# Patient Record
Sex: Female | Born: 1990 | Race: Black or African American | Hispanic: No | Marital: Single | Smoking: Never smoker
Health system: Southern US, Community
[De-identification: ages and names within clinical notes are randomized; demographics above are authoritative.]

---

## 2013-08-23 ENCOUNTER — Emergency Department: Payer: Self-pay | Admitting: Internal Medicine

## 2013-08-25 ENCOUNTER — Ambulatory Visit: Payer: Self-pay | Admitting: Family Medicine

## 2013-10-16 ENCOUNTER — Encounter: Payer: Self-pay | Admitting: Family Medicine

## 2013-10-16 ENCOUNTER — Ambulatory Visit (INDEPENDENT_AMBULATORY_CARE_PROVIDER_SITE_OTHER): Payer: BC Managed Care – PPO | Admitting: Family Medicine

## 2013-10-16 VITALS — BP 112/64 | HR 75 | Temp 98.3°F | Ht 65.0 in | Wt 191.2 lb

## 2013-10-16 DIAGNOSIS — R21 Rash and other nonspecific skin eruption: Secondary | ICD-10-CM

## 2013-10-16 MED ORDER — KETOCONAZOLE 2 % EX CREA: TOPICAL_CREAM | CUTANEOUS | Status: AC

## 2013-10-16 NOTE — Progress Notes (Signed)
Pre visit review using our clinic review tool, if applicable. No additional management support is needed unless otherwise documented below in the visit note. 

## 2013-10-16 NOTE — Assessment & Plan Note (Signed)
Appearance and distribution consistent with tinea versicolor.  Will treat with 21 day course of ketoconazole cream 2%.  She will follow up with me in 1 month- also CPX/pap at that time.

## 2013-10-16 NOTE — Progress Notes (Signed)
   Subjective:   Patient ID: Savannah Robinson, female    DOB: [birth year], 23 y.o.   MRN: [id number]  Savannah Robinson is a pleasant 23 y.o. year old female who presents to clinic today with Establish Care  on 10/16/2013  HPI: Rash - hypopigmented on neck and upper arms, intermittently for almost two years.  Seems worse in the summer or when she gets hot. Not itchy.  Otherwise has no complaints.  Patient Active Problem List   Diagnosis Date Noted  . Rash and nonspecific skin eruption 10/16/2013   History reviewed. No pertinent past medical history. History reviewed. No pertinent past surgical history. History  Substance Use Topics  . Smoking status: Never Smoker   . Smokeless tobacco: Never Used  . Alcohol Use: No   Family History  Problem Relation Age of Onset  . Hypertension Maternal Grandmother   . Mental illness Maternal Grandmother   . Diabetes Maternal Grandmother   . Mental illness Paternal Grandmother    No Known Allergies No current outpatient prescriptions on file prior to visit.   No current facility-administered medications on file prior to visit.   The PMH, PSH, Social History, Family History, Medications, and allergies have [facility], and have been updated if relevant.   Review of Systems    See HPI No painful lesions, it is sometimes dry and raised Objective:    BP 112/64  Pulse 75  Temp(Src) 98.3 F (36.8 C) (Oral)  Ht 5\' 5"  (1.651 m)  Wt 191 lb 4 oz (86.75 kg)  BMI 31.83 kg/m2  SpO2 98%  LMP 09/29/2013   Physical Exam  Constitutional: She is oriented to person, place, and time. She appears well-developed and well-nourished. No distress.  Musculoskeletal: Normal range of motion.  Neurological: She is alert and oriented to person, place, and time.  Skin: Skin is warm. Rash noted.     Psychiatric: She has a normal mood and affect. Her behavior is normal. Judgment and thought content normal.          Assessment & Plan:   Rash and  nonspecific skin eruption Return in about 1 month (around 11/16/2013) for a complete physical..

## 2013-10-16 NOTE — Patient Instructions (Addendum)
Please come back at your convenience for a complete physical and pap smear.   Tinea Versicolor Tinea versicolor is a common yeast infection of the skin. This condition becomes known when the yeast on our skin starts to overgrow (yeast is a normal inhabitant on our skin). This condition is noticed as white or light brown patches on brown skin, and is more evident in the summer on tanned skin. These areas are slightly scaly if scratched. The light patches from the yeast become evident when the yeast creates "holes in your suntan". This is most often noticed in the summer. The patches are usually located on the chest, back, pubis, neck and body folds. However, it may occur on any area of body. Mild itching and inflammation (redness or soreness) may be present. DIAGNOSIS  The diagnosisof this is made clinically (by looking). Cultures from samples are usually not needed. Examination under the microscope may help. However, yeast is normally found on skin. The diagnosis still remains clinical. Examination under Wood's Ultraviolet Light can determine the extent of the infection. TREATMENT  This common infection is usually only of cosmetic (only a concern to your appearance). It is easily treated with dandruff shampoo used during showers or bathing. Vigorous scrubbing will eliminate the yeast over several days time. The light areas in your skin may remain for weeks or months after the infection is cured unless your skin is exposed to sunlight. The lighter or darker spots caused by the fungus that remain after complete treatment are not a sign of treatment failure; it will take a long time to resolve. Your caregiver may recommend a number of commercial preparations or medication by mouth if home care is not working. Recurrence is common and preventative medication may be necessary. This skin condition is not highly contagious. Special care is not needed to protect close friends and family members. Normal hygiene is  usually enough. Follow up is required only if you develop complications (such as a secondary infection from scratching), if recommended by your caregiver, or if no relief is obtained from the preparations used. Document Released: 07/31/2000 Document Revised: 10/26/2011 Document Reviewed: 09/12/2008 [facility] Patient Information 2014 [place], [place].

## 2013-12-08 ENCOUNTER — Other Ambulatory Visit: Payer: Self-pay | Admitting: Family Medicine

## 2013-12-08 DIAGNOSIS — Z0000 Encounter for general adult medical examination without abnormal findings: Secondary | ICD-10-CM

## 2013-12-08 DIAGNOSIS — Z136 Encounter for screening for cardiovascular disorders: Secondary | ICD-10-CM

## 2013-12-19 ENCOUNTER — Other Ambulatory Visit (INDEPENDENT_AMBULATORY_CARE_PROVIDER_SITE_OTHER): Payer: BC Managed Care – PPO

## 2013-12-19 DIAGNOSIS — Z136 Encounter for screening for cardiovascular disorders: Secondary | ICD-10-CM

## 2013-12-19 DIAGNOSIS — Z0000 Encounter for general adult medical examination without abnormal findings: Secondary | ICD-10-CM

## 2013-12-20 LAB — COMPREHENSIVE METABOLIC PANEL
ALBUMIN: 4.1 g/dL (ref 3.5–5.2)
ALK PHOS: 31 U/L — AB (ref 39–117)
ALT: 11 U/L (ref 0–35)
AST: 15 U/L (ref 0–37)
BUN: 11 mg/dL (ref 6–23)
CHLORIDE: 108 meq/L (ref 96–112)
CO2: 27 mEq/L (ref 19–32)
CREATININE: 0.9 mg/dL (ref 0.4–1.2)
Calcium: 9.5 mg/dL (ref 8.4–10.5)
GFR: 87.92 mL/min (ref >60.00)
Glucose, Bld: 78 mg/dL (ref 70–99)
Potassium: 4.2 mEq/L (ref 3.5–5.1)
SODIUM: 140 meq/L (ref 135–145)
Total Bilirubin: 0.4 mg/dL (ref 0.2–1.2)
Total Protein: 7 g/dL (ref 6.0–8.3)

## 2013-12-20 LAB — CBC WITH DIFFERENTIAL/PLATELET
BASOS ABS: 0.1 10*3/uL (ref 0.0–0.1)
Basophils Relative: 4.2 % — ABNORMAL HIGH (ref 0.0–3.0)
EOS ABS: 0.1 10*3/uL (ref 0.0–0.7)
Eosinophils Relative: 4.4 % (ref 0.0–5.0)
HCT: 37.3 % (ref 36.0–46.0)
HEMOGLOBIN: 12.7 g/dL (ref 12.0–15.0)
LYMPHS PCT: 41.9 % (ref 12.0–46.0)
Lymphs Abs: 1.3 10*3/uL (ref 0.7–4.0)
MCHC: 34.2 g/dL (ref 30.0–36.0)
MCV: 91.6 fl (ref 78.0–100.0)
Monocytes Absolute: 0.2 10*3/uL (ref 0.1–1.0)
Monocytes Relative: 6.9 % (ref 3.0–12.0)
NEUTROS ABS: 1.4 10*3/uL (ref 1.4–7.7)
Neutrophils Relative %: 42.6 % — ABNORMAL LOW (ref 43.0–77.0)
PLATELETS: 200 10*3/uL (ref 150.0–400.0)
RBC: 4.07 Mil/uL (ref 3.87–5.11)
RDW: 12.6 % (ref 11.5–15.5)
WBC: 3.2 10*3/uL — ABNORMAL LOW (ref 4.0–10.5)

## 2013-12-20 LAB — LIPID PANEL
Cholesterol: 169 mg/dL (ref 0–200)
HDL: 69.3 mg/dL (ref >39.00)
LDL CALC: 92 mg/dL (ref 0–99)
TRIGLYCERIDES: 39 mg/dL (ref 0.0–149.0)
Total CHOL/HDL Ratio: 2
VLDL: 7.8 mg/dL (ref 0.0–40.0)

## 2013-12-20 LAB — TSH: TSH: 0.82 u[IU]/mL (ref 0.35–4.50)

## 2013-12-26 ENCOUNTER — Encounter: Payer: BC Managed Care – PPO | Admitting: Family Medicine

## 2014-02-06 ENCOUNTER — Encounter: Payer: BC Managed Care – PPO | Admitting: Family Medicine

## 2014-03-06 ENCOUNTER — Telehealth: Payer: Self-pay | Admitting: Family Medicine

## 2014-03-06 NOTE — Telephone Encounter (Signed)
The only avail slot we have is 8/12 @ 12. She may want to consider another provider if she is needing to be seen before 08/05

## 2014-03-06 NOTE — Telephone Encounter (Signed)
Pt called needing a mini cpx for work  Please advise date and time i can put her in.  Pt stated she needed before 8/5

## 2014-03-07 NOTE — Telephone Encounter (Signed)
Pt aware of appointment on 03/28/14.  She stated she would call back to let us know if she couldn't make this appointment. She also is aware that we left her cpx in sept

## 2014-03-28 ENCOUNTER — Encounter: Payer: BC Managed Care – PPO | Admitting: Family Medicine

## 2014-03-29 ENCOUNTER — Encounter: Payer: Self-pay | Admitting: Family Medicine

## 2014-03-29 ENCOUNTER — Other Ambulatory Visit (HOSPITAL_COMMUNITY)
Admission: RE | Admit: 2014-03-29 | Discharge: 2014-03-29 | Disposition: A | Discharge status: Home / Self Care | Payer: BC Managed Care – PPO | Source: Ambulatory Visit | Attending: Family Medicine | Admitting: Family Medicine

## 2014-03-29 ENCOUNTER — Ambulatory Visit (INDEPENDENT_AMBULATORY_CARE_PROVIDER_SITE_OTHER): Payer: BC Managed Care – PPO | Admitting: Family Medicine

## 2014-03-29 ENCOUNTER — Encounter (INDEPENDENT_AMBULATORY_CARE_PROVIDER_SITE_OTHER): Payer: Self-pay

## 2014-03-29 VITALS — BP 116/76 | HR 73 | Temp 98.3°F | Ht 65.75 in | Wt 192.8 lb

## 2014-03-29 DIAGNOSIS — Z1151 Encounter for screening for human papillomavirus (HPV): Secondary | ICD-10-CM | POA: Diagnosis present

## 2014-03-29 DIAGNOSIS — Z01419 Encounter for gynecological examination (general) (routine) without abnormal findings: Secondary | ICD-10-CM | POA: Insufficient documentation

## 2014-03-29 DIAGNOSIS — Z0000 Encounter for general adult medical examination without abnormal findings: Secondary | ICD-10-CM

## 2014-03-29 DIAGNOSIS — D72819 Decreased white blood cell count, unspecified: Secondary | ICD-10-CM

## 2014-03-29 LAB — CBC WITH DIFFERENTIAL/PLATELET
BASOS PCT: 0.8 % (ref 0.0–3.0)
Basophils Absolute: 0 10*3/uL (ref 0.0–0.1)
EOS ABS: 0.1 10*3/uL (ref 0.0–0.7)
EOS PCT: 3.9 % (ref 0.0–5.0)
HCT: 38.8 % (ref 36.0–46.0)
Hemoglobin: 13.1 g/dL (ref 12.0–15.0)
Lymphocytes Relative: 38.8 % (ref 12.0–46.0)
Lymphs Abs: 1.2 10*3/uL (ref 0.7–4.0)
MCHC: 33.7 g/dL (ref 30.0–36.0)
MCV: 91.6 fl (ref 78.0–100.0)
MONO ABS: 0.3 10*3/uL (ref 0.1–1.0)
Monocytes Relative: 8.4 % (ref 3.0–12.0)
NEUTROS ABS: 1.5 10*3/uL (ref 1.4–7.7)
Neutrophils Relative %: 48.1 % (ref 43.0–77.0)
Platelets: 209 10*3/uL (ref 150.0–400.0)
RBC: 4.23 Mil/uL (ref 3.87–5.11)
RDW: 13 % (ref 11.5–15.5)
WBC: 3.1 10*3/uL — ABNORMAL LOW (ref 4.0–10.5)

## 2014-03-29 NOTE — Progress Notes (Signed)
Subjective:   Patient ID: Savannah Robinson, female    DOB: 12/26/1990, 23 y.o.   MRN: [id number]  Savannah Robinson is a pleasant 23 y.o. year old female who presents to clinic today with Annual Exam  on 03/29/2014  HPI: G0- virginal. Has never had a pap smear. No family history of breast, uterine or cervical CA.  Had labs in 12/2013.  WBC count low.  Denies fatigue. Lab Results  Component Value Date   WBC 3.2* 12/20/2013   HGB 12.7 12/20/2013   HCT 37.3 12/20/2013   MCV 91.6 12/20/2013   PLT 200.0 12/20/2013   Lab Results  Component Value Date   CHOL 169 12/20/2013   HDL 69.30 12/20/2013   LDLCALC 92 12/20/2013   TRIG 39.0 12/20/2013   CHOLHDL 2 12/20/2013   Lab Results  Component Value Date   CREATININE 0.9 12/20/2013   Current Outpatient Prescriptions on File Prior to Visit  Medication Sig Dispense Refill  . ketoconazole (NIZORAL) 2 % cream 1 application daily x 21 days  30 g  0   No current facility-administered medications on file prior to visit.    No Known Allergies  No past medical history on file.  No past surgical history on file.  Family History  Problem Relation Age of Onset  . Hypertension Maternal Grandmother   . Mental illness Maternal Grandmother   . Diabetes Maternal Grandmother   . Mental illness Paternal Grandmother     History   Social History  . Marital Status: Single    Spouse Name: N/A    Number of Children: N/A  . Years of Education: N/A   Occupational History  . Not on file.   Social History Main Topics  . Smoking status: Never Smoker   . Smokeless tobacco: Never Used  . Alcohol Use: No  . Drug Use: No  . Sexual Activity: No   Other Topics Concern  . Not on file   Social History Narrative   Middle school teacher   Virginal   The PMH, PSH, Social History, Family History, Medications, and allergies have been [facility], and have been updated if relevant.   Review of Systems    See HPI Patient reports no  vision/ hearing  changes,anorexia, weight change, fever ,adenopathy, persistant / recurrent hoarseness, swallowing issues, chest pain, edema,persistant / recurrent cough, hemoptysis, dyspnea(rest, exertional, paroxysmal nocturnal), gastrointestinal  bleeding (melena, rectal bleeding), abdominal pain, excessive heart burn, GU symptoms(dysuria, hematuria, pyuria, voiding/incontinence  Issues) syncope, focal weakness, severe memory loss, concerning skin lesions, depression, anxiety, abnormal bruising/bleeding, major joint swelling, breast masses or abnormal vaginal bleeding.    Objective:    BP 116/76  Pulse 73  Temp(Src) 98.3 F (36.8 C) (Oral)  Ht 5' 5.75" (1.67 m)  Wt 192 lb 12 oz (87.431 kg)  BMI 31.35 kg/m2  SpO2 98%  LMP 03/23/2014   Physical Exam   General:  Well-developed,well-nourished,in no acute distress; alert,appropriate and cooperative throughout examination Head:  normocephalic and atraumatic.   Eyes:  vision grossly intact, pupils equal, pupils round, and pupils reactive to light.   Ears:  R ear normal and L ear normal.   Nose:  no external deformity.   Mouth:  good dentition.   Neck:  No deformities, masses, or tenderness noted. Breasts:  No mass, nodules, thickening, tenderness, bulging, retraction, inflamation, nipple discharge or skin changes noted.   Lungs:  Normal respiratory effort, chest expands symmetrically. Lungs are clear to auscultation, no crackles or  wheezes. Heart:  Normal rate and regular rhythm. S1 and S2 normal without gallop, murmur, click, rub or other extra sounds. Abdomen:  Bowel sounds positive,abdomen soft and non-tender without masses, organomegaly or hernias noted. Rectal:  no external abnormalities.   Genitalia:  Pelvic Exam:        External: normal female genitalia without lesions or masses        Vagina: normal without lesions or masses        Cervix: normal without lesions or masses        Adnexa: normal bimanual exam without masses or fullness         Uterus: normal by palpation        Pap smear: performed Msk:  No deformity or scoliosis noted of thoracic or lumbar spine.   Extremities:  No clubbing, cyanosis, edema, or deformity noted with normal full range of motion of all joints.   Neurologic:  alert & oriented X3 and gait normal.   Skin:  Intact without suspicious lesions or rashes Cervical Nodes:  No lymphadenopathy noted Axillary Nodes:  No palpable lymphadenopathy Psych:  Cognition and judgment appear intact. Alert and cooperative with normal attention span and concentration. No apparent delusions, illusions, hallucinations       Assessment & Plan:   Leukopenia - Plan: CBC with Differential  Routine general medical examination at a health care facility  Encounter for routine gynecological examination No Follow-up on file.

## 2014-03-29 NOTE — Assessment & Plan Note (Signed)
Pap smear done today

## 2014-03-29 NOTE — Addendum Note (Signed)
Addended by: Desmond DikeKNIGHT, Keylah Darwish H on: 03/29/2014 02:08 PM   Modules accepted: Orders

## 2014-03-29 NOTE — Patient Instructions (Signed)
Good to see you. We will call you with your lab and pap smear results.

## 2014-03-29 NOTE — Assessment & Plan Note (Signed)
Recheck CBC today. 

## 2014-03-29 NOTE — Assessment & Plan Note (Signed)
Reviewed preventive care protocols, scheduled due services, and updated immunizations Discussed nutrition, exercise, diet, and healthy lifestyle.  

## 2014-03-29 NOTE — Progress Notes (Signed)
Pre visit review using our clinic review tool, if applicable. No additional management support is needed unless otherwise documented below in the visit note. 

## 2014-04-02 ENCOUNTER — Encounter: Payer: Self-pay | Admitting: *Deleted

## 2014-04-02 LAB — CYTOLOGY - PAP

## 2014-05-15 ENCOUNTER — Encounter: Payer: BC Managed Care – PPO | Admitting: Family Medicine

## 2017-03-30 ENCOUNTER — Encounter: Payer: Self-pay | Admitting: Emergency Medicine

## 2017-03-30 ENCOUNTER — Emergency Department
Admission: EM | Admit: 2017-03-30 | Discharge: 2017-03-30 | Disposition: A | Discharge status: Home / Self Care | Payer: BC Managed Care – PPO | Attending: Emergency Medicine | Admitting: Emergency Medicine

## 2017-03-30 ENCOUNTER — Emergency Department: Payer: BC Managed Care – PPO

## 2017-03-30 DIAGNOSIS — R002 Palpitations: Secondary | ICD-10-CM | POA: Diagnosis not present

## 2017-03-30 DIAGNOSIS — M79661 Pain in right lower leg: Secondary | ICD-10-CM | POA: Insufficient documentation

## 2017-03-30 LAB — CBC
HCT: 39.4 % (ref 35.0–47.0)
HEMOGLOBIN: 13.7 g/dL (ref 12.0–16.0)
MCH: 31.1 pg (ref 26.0–34.0)
MCHC: 34.8 g/dL (ref 32.0–36.0)
MCV: 89.4 fL (ref 80.0–100.0)
PLATELETS: 220 10*3/uL (ref 150–440)
RBC: 4.41 MIL/uL (ref 3.80–5.20)
RDW: 12.6 % (ref 11.5–14.5)
WBC: 3.3 10*3/uL — AB (ref 3.6–11.0)

## 2017-03-30 LAB — BASIC METABOLIC PANEL
ANION GAP: 7 (ref 5–15)
BUN: 9 mg/dL (ref 6–20)
CALCIUM: 9.4 mg/dL (ref 8.9–10.3)
CO2: 25 mmol/L (ref 22–32)
CREATININE: 0.83 mg/dL (ref 0.44–1.00)
Chloride: 105 mmol/L (ref 101–111)
GFR calc non Af Amer: >60 mL/min (ref >60)
Glucose, Bld: 93 mg/dL (ref 65–99)
Potassium: 3.7 mmol/L (ref 3.5–5.1)
SODIUM: 137 mmol/L (ref 135–145)

## 2017-03-30 LAB — TROPONIN I

## 2017-03-30 NOTE — Discharge Instructions (Signed)
Take motrin as needed for pain. You likely have muscle strain.   See your doctor   Return to ER if you have worse palpitations, chest pain, calf pain or swelling.

## 2017-03-30 NOTE — ED Provider Notes (Signed)
[facility]-EMERGENCY DEPARTMENT Provider Note   CSN: [id number] Arrival date & time: 03/30/17  1005     History   Chief Complaint Chief Complaint  Patient presents with  . Palpitations    HPI Danecia Pasty ArchJ Ashmead is a 26 y.o. female otherwise healthy here presenting with right calf pain, palpitations. Patient had right calf pain since yesterday. Also has some left-sided chest pain and palpitations this morning. Denies any shortness of breath or recent travel. Patient states that she is otherwise healthy and is not currently on birth control.   The history is provided by the patient.    History reviewed. No pertinent past medical history.  Patient Active Problem List   Diagnosis Date Noted  . Leukopenia 03/29/2014  . Routine general medical examination at a health care facility 03/29/2014  . Encounter for routine gynecological examination 03/29/2014    History reviewed. No pertinent surgical history.  OB History    No data available       Home Medications    Prior to Admission medications   Medication Sig Start Date End Date Taking? Authorizing Provider  ketoconazole (NIZORAL) 2 % cream 1 application daily x 21 days Patient not taking: Reported on 03/30/2017 10/16/13   Dianne DunAron, Talia M, MD    Family History Family History  Problem Relation Age of Onset  . Hypertension Maternal Grandmother   . Mental illness Maternal Grandmother   . Diabetes Maternal Grandmother   . Mental illness Paternal Grandmother     Social History Social History  Substance Use Topics  . Smoking status: Never Smoker  . Smokeless tobacco: Never Used  . Alcohol use No     Allergies   Patient has no known allergies.   Review of Systems Review of Systems  Cardiovascular: Positive for palpitations.  All other systems reviewed and are negative.    Physical Exam Updated Vital Signs BP 111/76 (BP Location: Right Arm)   Pulse 63   Temp 97.8 F (36.6 C) (Oral)   Resp 16   Ht 5\' 6"  (1.676 m)    Wt 97.5 kg (215 lb)   LMP 03/09/2017 (Approximate)   SpO2 100%   BMI 34.70 kg/m   Physical Exam  Constitutional: She is oriented to person, place, and time. She appears well-developed.  Slightly anxious   HENT:  Head: Normocephalic.  Mouth/Throat: Oropharynx is clear and moist.  Eyes: Pupils are equal, round, and reactive to light. Conjunctivae and EOM are normal.  Neck: Normal range of motion. Neck supple.  Cardiovascular: Normal rate, regular rhythm and normal heart sounds.   Pulmonary/Chest: Effort normal and breath sounds normal. No respiratory distress. She has no wheezes.  Abdominal: Soft. Bowel sounds are normal. She exhibits no distension. There is no tenderness.  Musculoskeletal:  Mild R calf tenderness, no obvious deformity. Nl pulses   Neurological: She is alert and oriented to person, place, and time.  Skin: Skin is warm.  Psychiatric: She has a normal mood and affect.  Nursing note and vitals reviewed.    ED Treatments / Results  Labs (all labs ordered are listed, but only abnormal results are displayed) Labs Reviewed  CBC - Abnormal; Notable for the following:       Result Value   WBC 3.3 (*)    All other components within normal limits  BASIC METABOLIC PANEL  TROPONIN I    EKG  EKG Interpretation  Date/Time:  Tuesday March 30 2017 10:15:14 EDT Ventricular Rate:  77 PR Interval:  170 QRS  Duration: 82 QT Interval:  356 QTC Calculation: 402 R Axis:     Text Interpretation:  Normal sinus rhythm Cannot rule out Anterior infarct , age undetermined Abnormal ECG No previous ECGs available No previous ECGs available Confirmed by Richardean Canal ([id number]) on 03/30/2017 12:06:01 PM       Radiology Dg Chest 2 View  Result Date: 03/30/2017 CLINICAL DATA:  Shortness of breath . EXAM: CHEST  2 VIEW COMPARISON:  No prior . FINDINGS: Mediastinum and hilar structures normal. Lungs are clear. No pleural effusion or pneumothorax. Heart size normal. No acute bony  abnormality . IMPRESSION: No acute cardiopulmonary disease. Electronically Signed   By: Maisie Fus  Register   On: 03/30/2017 10:43   US Venous Img Lower Unilateral Right  Result Date: 03/30/2017 CLINICAL DATA:  One day of right calf tingling sensation. EXAM: Right LOWER EXTREMITY VENOUS DOPPLER ULTRASOUND TECHNIQUE: Gray-scale sonography with graded compression, as well as color Doppler and duplex ultrasound were performed to evaluate the lower extremity deep venous systems from the level of the common femoral vein and including the common femoral, femoral, profunda femoral, popliteal and calf veins including the posterior tibial, peroneal and gastrocnemius veins when visible. The superficial great saphenous vein was also interrogated. Spectral Doppler was utilized to evaluate flow at rest and with distal augmentation maneuvers in the common femoral, femoral and popliteal veins. COMPARISON:  None. FINDINGS: Contralateral Common Femoral Vein: Respiratory phasicity is normal and symmetric with the symptomatic side. No evidence of thrombus. Normal compressibility. Common Femoral Vein: No evidence of thrombus. Normal compressibility, respiratory phasicity and response to augmentation. Saphenofemoral Junction: No evidence of thrombus. Normal compressibility and flow on color Doppler imaging. Profunda Femoral Vein: No evidence of thrombus. Normal compressibility and flow on color Doppler imaging. Femoral Vein: No evidence of thrombus. Normal compressibility, respiratory phasicity and response to augmentation. Popliteal Vein: No evidence of thrombus. Normal compressibility, respiratory phasicity and response to augmentation. Calf Veins: No evidence of thrombus. Normal compressibility and flow on color Doppler imaging. Superficial Great Saphenous Vein: No evidence of thrombus. Normal compressibility and flow on color Doppler imaging. Venous Reflux:  None. Other Findings:  None. IMPRESSION: No evidence of DVT within the  right lower extremity. Electronically Signed   By: Garrie Woodin  [place] M.D.   On: 03/30/2017 13:00    Procedures Procedures (including critical care time)  Medications Ordered in ED Medications - No data to display   Initial Impression / Assessment and Plan / ED Course  I have reviewed the triage vital signs and the nursing notes.  Pertinent labs & imaging results that were available during my care of the patient were reviewed by me and considered in my medical decision making (see chart for details).    Mabel ALLAHNA HUSBAND is a 26 y.o. female here with R calf pain, chest pain, palpitations. HR 70s in the ED. Vitals stable. O2 nl. Mild R calf tenderness. Will get DVT study, labs.   1:09 PM Labs unremarkable. CXR clear. RLE DVT study neg. Likely muscle strain. I doubt PE. Will dc home.    Final Clinical Impressions(s) / ED Diagnoses   Final diagnoses:  None    New Prescriptions New Prescriptions   No medications on file     Charlynne Pander, MD 03/30/17 1309

## 2017-03-30 NOTE — ED Notes (Signed)
Patient to X-Ray via W/C

## 2017-03-30 NOTE — ED Notes (Signed)
Patient returns from Radiology

## 2017-03-30 NOTE — ED Notes (Signed)
Patient back from ultrasound. Resting in bed. Vitals updated.

## 2017-03-30 NOTE — ED Notes (Signed)
Radial pulse 84 and regular at registration desk.

## 2017-03-30 NOTE — ED Triage Notes (Signed)
Pt c/o feeling like heart was racing this morning.  Has had some SHOB as well.  C/o pressure like feeling in left chest when palpitations were present.  No longer feels like heart racing. No pain currently. NAD. VSS.

## 2017-03-30 NOTE — ED Notes (Signed)
Radial pulse 76 and regular.  Advised patient to make us aware if she felt her heart palpitations return.

## 2017-03-30 NOTE — ED Notes (Signed)
Attempted ekg, machine note working, iwll obtain new machine

## 2019-09-15 ENCOUNTER — Other Ambulatory Visit: Payer: Self-pay

## 2019-09-15 ENCOUNTER — Emergency Department
Admission: EM | Admit: 2019-09-15 | Discharge: 2019-09-15 | Disposition: A | Discharge status: Home / Self Care | Payer: BC Managed Care – PPO | Attending: Student | Admitting: Student

## 2019-09-15 ENCOUNTER — Encounter: Payer: Self-pay | Admitting: Emergency Medicine

## 2019-09-15 ENCOUNTER — Emergency Department: Payer: BC Managed Care – PPO

## 2019-09-15 DIAGNOSIS — S161XXA Strain of muscle, fascia and tendon at neck level, initial encounter: Secondary | ICD-10-CM | POA: Insufficient documentation

## 2019-09-15 DIAGNOSIS — Y92531 Health care provider office as the place of occurrence of the external cause: Secondary | ICD-10-CM | POA: Diagnosis not present

## 2019-09-15 DIAGNOSIS — Y9389 Activity, other specified: Secondary | ICD-10-CM | POA: Diagnosis not present

## 2019-09-15 DIAGNOSIS — Y999 Unspecified external cause status: Secondary | ICD-10-CM | POA: Diagnosis not present

## 2019-09-15 DIAGNOSIS — R519 Headache, unspecified: Secondary | ICD-10-CM | POA: Insufficient documentation

## 2019-09-15 DIAGNOSIS — X509XXA Other and unspecified overexertion or strenuous movements or postures, initial encounter: Secondary | ICD-10-CM | POA: Insufficient documentation

## 2019-09-15 DIAGNOSIS — S199XXA Unspecified injury of neck, initial encounter: Secondary | ICD-10-CM | POA: Diagnosis present

## 2019-09-15 MED ORDER — KETOROLAC TROMETHAMINE 30 MG/ML IJ SOLN
30.0000 mg | Freq: Once | INTRAMUSCULAR | Status: AC
  Administered 2019-09-15: 30 mg via INTRAMUSCULAR
  Filled 2019-09-15: qty 1

## 2019-09-15 MED ORDER — HYDROCODONE-ACETAMINOPHEN 5-325 MG PO TABS: 1.0000 | ORAL_TABLET | Freq: Four times a day (QID) | ORAL | 0 refills | Status: AC | PRN

## 2019-09-15 NOTE — ED Provider Notes (Signed)
[facility] Emergency Department Provider Note ____________________________________________  Time seen: Approximately 9:47 PM  I have reviewed the triage vital signs and the nursing notes.   HISTORY  Chief Complaint Neck Pain    HPI Savannah Robinson is a 29 y.o. female who presents to the emergency department for evaluation and treatment of left side headache and left side neck pain after having her neck "adjusted" by her chiropractor earlier today. She states that she had fallen some time ago and had gone for adjustment of her lower back and in the process began to have neck pain. She continued her appointments with the chiropractor. She usually feels a little sore afterward, but not this bad. She feels a "lump" in the left side of her neck and is unable to turn her head. Pain radiates into the scalp. No relief with Aleve and flexeril.  History reviewed. No pertinent past medical history.  Patient Active Problem List   Diagnosis Date Noted  . Leukopenia 03/29/2014  . Routine general medical examination at a health care facility 03/29/2014  . Encounter for routine gynecological examination 03/29/2014    History reviewed. No pertinent surgical history.  Prior to Admission medications   Medication Sig Start Date End Date Taking? Authorizing Provider  HYDROcodone-acetaminophen (NORCO/VICODIN) 5-325 MG tablet Take 1 tablet by mouth every 6 (six) hours as needed for up to 3 days for severe pain. 09/15/19 09/18/19  Chinita Pester, FNP  ketoconazole (NIZORAL) 2 % cream 1 application daily x 21 days Patient not taking: Reported on 03/30/2017 10/16/13   Dianne Dun, MD    Allergies Patient has no known allergies.  Family History  Problem Relation Age of Onset  . Hypertension Maternal Grandmother   . Mental illness Maternal Grandmother   . Diabetes Maternal Grandmother   . Mental illness Paternal Grandmother     Social History Social History   Tobacco Use  .  Smoking status: Never Smoker  . Smokeless tobacco: Never Used  Substance Use Topics  . Alcohol use: No  . Drug use: No    Review of Systems Constitutional: Negative for fever. Cardiovascular: Negative for chest pain. Respiratory: Negative for shortness of breath. Musculoskeletal: Positive for neck pain. Skin: Negative for open wounds or lesions.  Neurological: Positive for decrease in sensation on the left scalp area. Positive for headache.  ____________________________________________   PHYSICAL EXAM:  VITAL SIGNS: ED Triage Vitals  Enc Vitals Group     BP 09/15/19 2056 (!) 155/77     Pulse Rate 09/15/19 2056 (!) 103     Resp 09/15/19 2056 18     Temp 09/15/19 2056 98.1 F (36.7 C)     Temp Source 09/15/19 2056 Oral     SpO2 09/15/19 2056 100 %     Weight 09/15/19 2104 225 lb (102.1 kg)     Height 09/15/19 2104 5\' 6"  (1.676 m)     Head Circumference --      Peak Flow --      Pain Score 09/15/19 2103 6     Pain Loc --      Pain Edu? --      Excl. in GC? --     Constitutional: Alert and oriented. Well appearing and in no acute distress. Eyes: Conjunctivae are clear without discharge or drainage Head: Atraumatic Neck: Focal tenderness over the lateral cervical spine. Respiratory: No cough. Respirations are even and unlabored. Musculoskeletal: Unable to perform rotation of head due to neck pain. Neurologic:  Awake, alert, oriented.  Skin: Intact without erythema or lesion.  Psychiatric: Affect and behavior are appropriate.  ____________________________________________   LABS (all labs ordered are listed, but only abnormal results are displayed)  Labs Reviewed - No data to display ____________________________________________  RADIOLOGY  CT of the head and cervical spine negative for acute findings.   I, Kem Boroughs, personally viewed and evaluated these images (plain radiographs) as part of my medical decision making, as well as reviewing the written report  by the radiologist.  CT Head Wo Contrast  Result Date: 09/15/2019 CLINICAL DATA:  Neck pain and headache EXAM: CT HEAD WITHOUT CONTRAST CT CERVICAL SPINE WITHOUT CONTRAST TECHNIQUE: Multidetector CT imaging of the head and cervical spine was performed following the standard protocol without intravenous contrast. Multiplanar CT image reconstructions of the cervical spine were also generated. COMPARISON:  None. FINDINGS: CT HEAD FINDINGS Brain: No evidence of acute infarction, hemorrhage, hydrocephalus, extra-axial collection or mass lesion/mass effect. Vascular: No hyperdense vessel or unexpected calcification. Skull: Normal. Negative for fracture or focal lesion. Sinuses/Orbits: No acute finding. Other: None CT CERVICAL SPINE FINDINGS Alignment: Mild reversal of cervical lordosis. No subluxation. Facet alignment is maintained Skull base and vertebrae: No acute fracture. No primary bone lesion or focal pathologic process. Soft tissues and spinal canal: No prevertebral fluid or swelling. No visible canal hematoma. Disc levels:  Within normal limits Upper chest: Negative. Other: None IMPRESSION: 1. Negative non contrasted CT appearance of the brain 2. Reversal of cervical lordosis.  No acute osseous abnormality Electronically Signed   By: Jasmine Pang M.D.   On: 09/15/2019 22:27   CT Cervical Spine Wo Contrast  Result Date: 09/15/2019 CLINICAL DATA:  Neck pain and headache EXAM: CT HEAD WITHOUT CONTRAST CT CERVICAL SPINE WITHOUT CONTRAST TECHNIQUE: Multidetector CT imaging of the head and cervical spine was performed following the standard protocol without intravenous contrast. Multiplanar CT image reconstructions of the cervical spine were also generated. COMPARISON:  None. FINDINGS: CT HEAD FINDINGS Brain: No evidence of acute infarction, hemorrhage, hydrocephalus, extra-axial collection or mass lesion/mass effect. Vascular: No hyperdense vessel or unexpected calcification. Skull: Normal. Negative for  fracture or focal lesion. Sinuses/Orbits: No acute finding. Other: None CT CERVICAL SPINE FINDINGS Alignment: Mild reversal of cervical lordosis. No subluxation. Facet alignment is maintained Skull base and vertebrae: No acute fracture. No primary bone lesion or focal pathologic process. Soft tissues and spinal canal: No prevertebral fluid or swelling. No visible canal hematoma. Disc levels:  Within normal limits Upper chest: Negative. Other: None IMPRESSION: 1. Negative non contrasted CT appearance of the brain 2. Reversal of cervical lordosis.  No acute osseous abnormality Electronically Signed   By: Jasmine Pang M.D.   On: 09/15/2019 22:27   ____________________________________________   PROCEDURES  Procedures  ____________________________________________   INITIAL IMPRESSION / ASSESSMENT AND PLAN / ED COURSE  Savannah Robinson is a 29 y.o. who presents to the emergency department for treatment and evaluation of head and neck pain after visit with chiropractor. Neck pain acutely worsened even after taking her Aleve and Flexeril. Patient is requesting "scans" to make sure everything is ok.   Differential diagnosis includes, but not limited to: muscle spasm, cervical radiculopathy, disc disease/herniation  CT scans are reassuring. She will be placed in a soft cervical collar to wear for comfort. She is to continue her muscle relaxer. She will be given an injection of Toradol while here and was advised to not take Aleve tonight. She is to follow up with orthopedics,  not chiropractor for symptoms that are not improving with medications. She is to return to the ER for symptoms that change or worsen if unable to schedule an appointment with PCP or orthopedics.   Medications  ketorolac (TORADOL) 30 MG/ML injection 30 mg (has no administration in time range)    Pertinent labs & imaging results that were available during my care of the patient were reviewed by me and considered in my medical  decision making (see chart for details).   _________________________________________   FINAL CLINICAL IMPRESSION(S) / ED DIAGNOSES  Final diagnoses:  Acute strain of neck muscle, initial encounter    ED Discharge Orders         Ordered    HYDROcodone-acetaminophen (NORCO/VICODIN) 5-325 MG tablet  Every 6 hours PRN     09/15/19 2255           If controlled substance prescribed during this visit, 12 month history viewed on the [place] prior to issuing an initial prescription for Schedule II or III opiod.   Victorino Dike, FNP 09/15/19 2308    Lilia Pro., MD 09/16/19 [phone]

## 2019-09-15 NOTE — ED Notes (Signed)
Pt states she has been having neck issues for the last few months. Had an adjustment today and now has increased pain even with her muscle relaxers and icing the area.

## 2019-09-15 NOTE — ED Triage Notes (Signed)
Pt presents to ER with complaints of neck pain and headache. Pt reports she has been dealing with neck pain for months and sees chiropractor for adjustments reports today after her visit she is sore and headache to left side occipital area. Pt reports headache on and off for 2 weeks. Pt describes dull pain. Pt reports tingling to left hand, pt denies any other symptoms at present. Pt talks in complete sentences no respiratory distress noted

## 2019-09-15 NOTE — Discharge Instructions (Signed)
Please follow up with orthopedics. Do not take Aleve tonight. Take your flexeril when you get home. Apply ice off and on tonight and tomorrow.

## 2019-11-03 ENCOUNTER — Ambulatory Visit: Payer: BC Managed Care – PPO | Attending: Internal Medicine

## 2019-11-03 DIAGNOSIS — Z23 Encounter for immunization: Secondary | ICD-10-CM

## 2019-11-03 NOTE — Progress Notes (Signed)
   Covid-19 Vaccination Clinic  Name:  YSABELLA BABIARZ    MRN: [id number] DOB: 08/28/1990  11/03/2019  Ms. Sabourin was observed post Covid-19 immunization for 15 minutes without incident. She was provided with Vaccine Information Sheet and instruction to access the V-Safe system.   Ms. Iseminger was instructed to call 911 with any severe reactions post vaccine: Marland Kitchen Difficulty breathing  . Swelling of face and throat  . A fast heartbeat  . A bad rash all over body  . Dizziness and weakness   Immunizations Administered    Name Date Dose VIS Date Route   Pfizer COVID-19 Vaccine 11/03/2019  9:27 AM 0.3 mL 07/28/2019 Intramuscular   Manufacturer: [employer], [employer]   Lot: [id number]   NDC: [address]-5

## 2019-11-28 ENCOUNTER — Ambulatory Visit: Payer: BC Managed Care – PPO | Attending: Internal Medicine

## 2019-11-28 DIAGNOSIS — Z23 Encounter for immunization: Secondary | ICD-10-CM

## 2019-11-28 NOTE — Progress Notes (Signed)
   Covid-19 Vaccination Clinic  Name:  DONYEA GAFFORD    MRN: [id number] DOB: 1991-01-13  11/28/2019  Ms. Garza was observed post Covid-19 immunization for 15 minutes without incident. She was provided with Vaccine Information Sheet and instruction to access the V-Safe system.   Ms. Dragos was instructed to call 911 with any severe reactions post vaccine: Marland Kitchen Difficulty breathing  . Swelling of face and throat  . A fast heartbeat  . A bad rash all over body  . Dizziness and weakness   Immunizations Administered    Name Date Dose VIS Date Route   Pfizer COVID-19 Vaccine 11/28/2019  5:09 PM 0.3 mL 07/28/2019 Intramuscular   Manufacturer: [employer], [employer]   Lot: [id number]   NDC: [address]-8

## 2020-01-08 ENCOUNTER — Other Ambulatory Visit: Payer: Self-pay | Admitting: Physical Medicine & Rehabilitation

## 2020-01-08 DIAGNOSIS — G8929 Other chronic pain: Secondary | ICD-10-CM

## 2020-01-21 ENCOUNTER — Other Ambulatory Visit: Payer: Self-pay

## 2020-01-21 ENCOUNTER — Ambulatory Visit
Admission: RE | Admit: 2020-01-21 | Discharge: 2020-01-21 | Disposition: A | Discharge status: Home / Self Care | Payer: BC Managed Care – PPO | Source: Ambulatory Visit | Attending: Physical Medicine & Rehabilitation | Admitting: Physical Medicine & Rehabilitation

## 2020-01-21 DIAGNOSIS — G8929 Other chronic pain: Secondary | ICD-10-CM | POA: Insufficient documentation

## 2020-01-21 DIAGNOSIS — M545 Low back pain, unspecified: Secondary | ICD-10-CM

## 2020-02-22 ENCOUNTER — Telehealth (HOSPITAL_COMMUNITY): Payer: Self-pay | Admitting: Psychiatry

## 2020-02-22 ENCOUNTER — Ambulatory Visit (HOSPITAL_COMMUNITY)
Admission: RE | Admit: 2020-02-22 | Discharge: 2020-02-22 | Disposition: A | Discharge status: Home / Self Care | Payer: BC Managed Care – PPO | Attending: Psychiatry | Admitting: Psychiatry
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
## 2020-02-22 NOTE — Telephone Encounter (Signed)
D:  Savannah Robinson (TTS) referred pt to MH-IOP.  A:  Called pt and oriented her.  Pt is declining at this time.  States she would prefer to follow up with her therapist.  Encouraged pt to call case manager if she ever wanted to try MH-IOP.  Inform Savannah Robinson. R:  Pt receptive.
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
## 2022-03-01 IMAGING — MR MR LUMBAR SPINE W/O CM
5 series · 31 of 48 positions shown · non-contrast
Comparison: None available.

CLINICAL DATA: Initial evaluation for low back pain status post
recent fall several months ago.

EXAM:
MRI LUMBAR SPINE WITHOUT CONTRAST
TECHNIQUE: Multiplanar, multisequence MR imaging of the lumbar spine was
performed. No intravenous contrast was administered.

[Series 5: T2 · sagittal · 4.0mm · 0.81mm/px · 6 of 15 slices shown (1 of 2)]
[im 1/15]
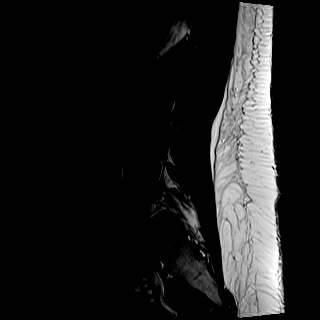
[im 3/15]
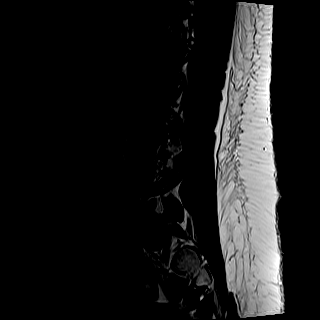
[im 6/15]
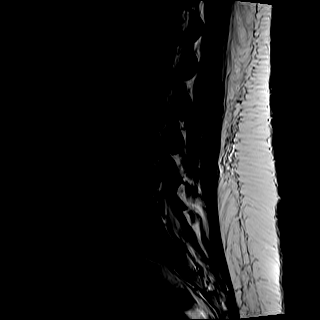
[im 9/15]
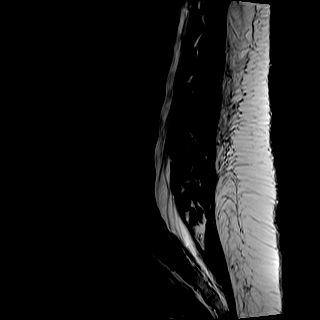
[im 12/15]
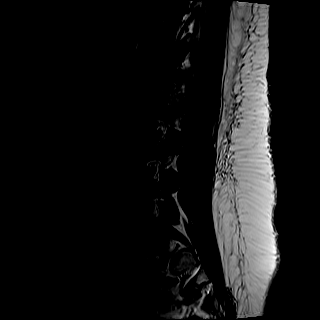
[im 15/15]
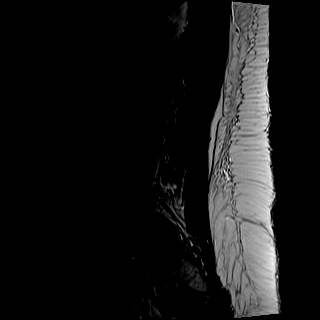

[Series 6: T1 · sagittal · 4.0mm · 0.81mm/px · 7 of 15 slices shown (1 of 2)]
[im 1/15]
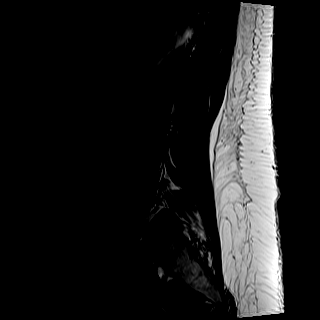
[im 3/15]
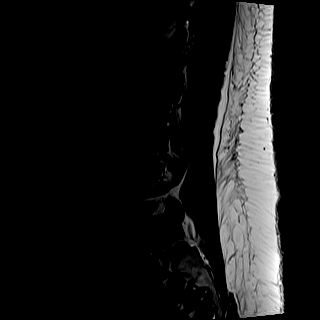
[im 5/15]
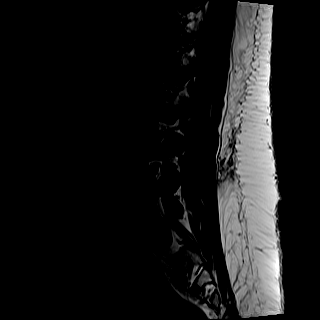
[im 8/15]
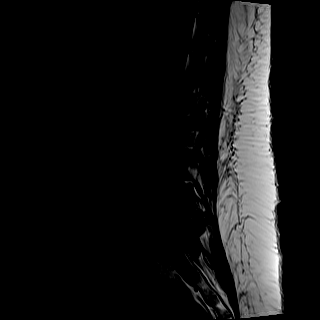
[im 10/15]
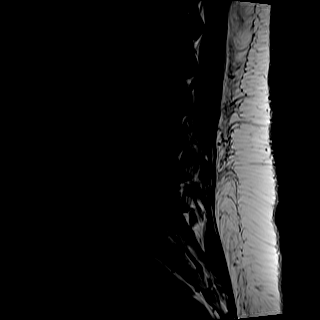
[im 12/15]
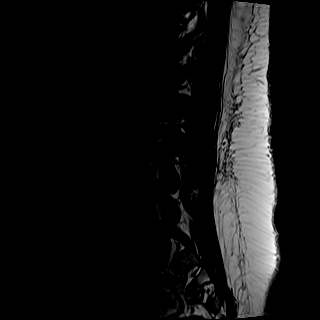
[im 15/15]
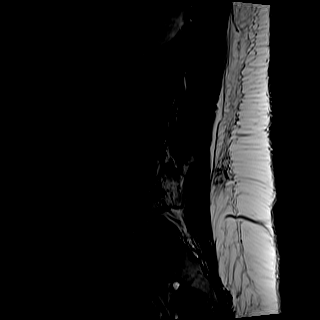

[Series 7: STIR · sagittal · 4.0mm · 0.41mm/px · 2 of 15 slices shown]
[im 1/15]
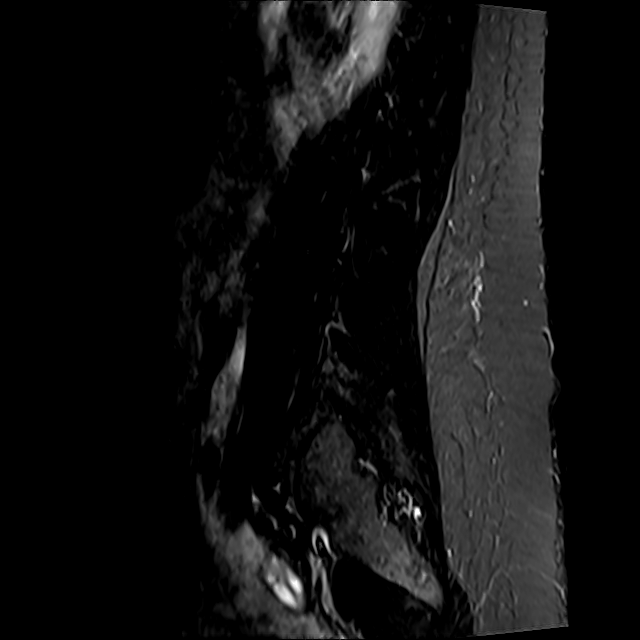
[im 3/15]
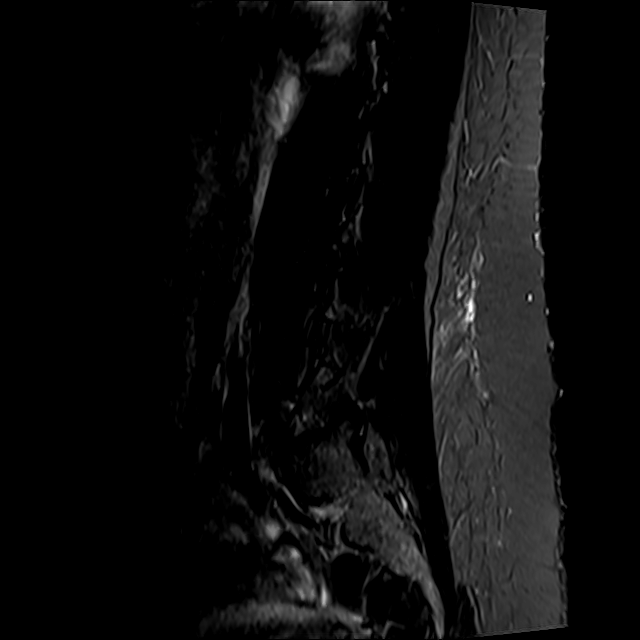

[Series 8: T2 · axial · 4.0mm · 0.78mm/px · z∈[-42,+154]mm · 8 of 32 slices shown (2 of 2)]
[im 1/32]
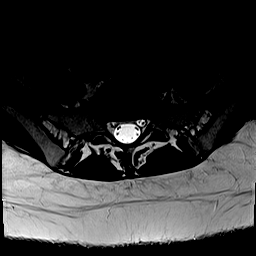
[im 5/32]
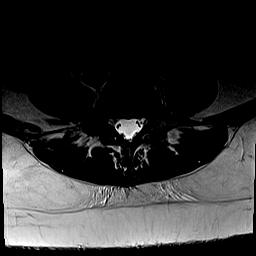
[im 10/32]
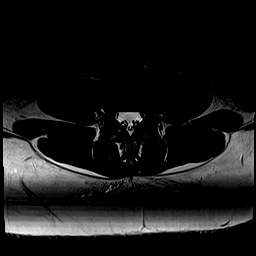
[im 15/32]
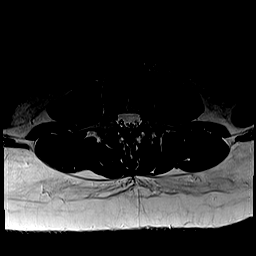
[im 17/32]
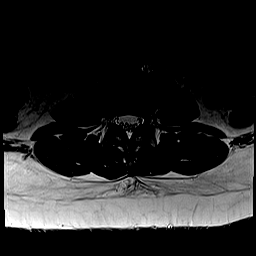
[im 22/32]
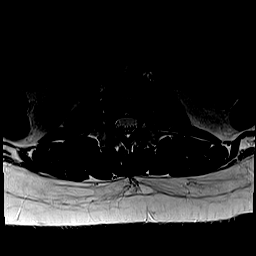
[im 27/32]
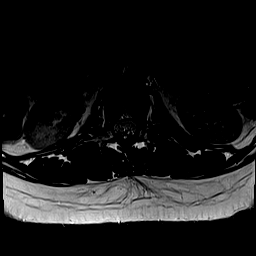
[im 32/32]
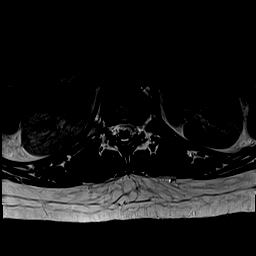

[Series 9: T1 · axial · 4.0mm · 0.39mm/px · z∈[-42,+154]mm · 8 of 32 slices shown (2 of 2)]
[im 1/32]
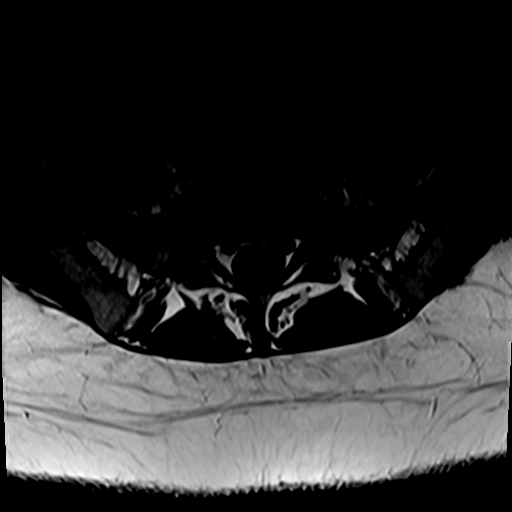
[im 5/32]
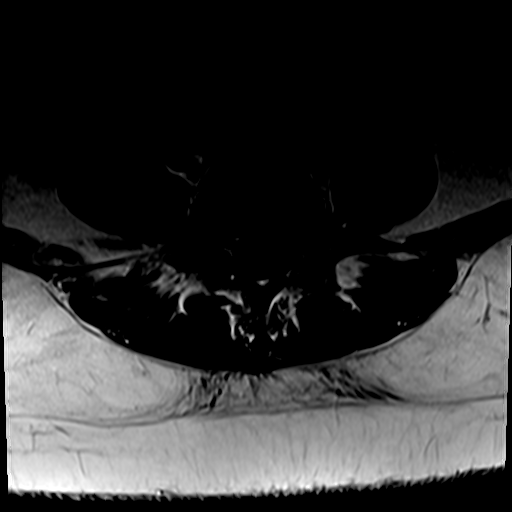
[im 10/32]
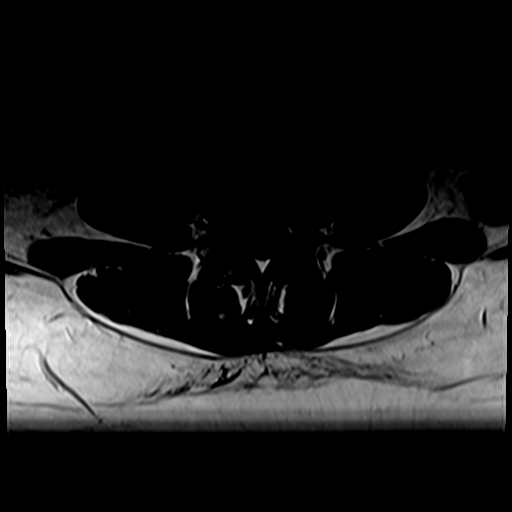
[im 15/32]
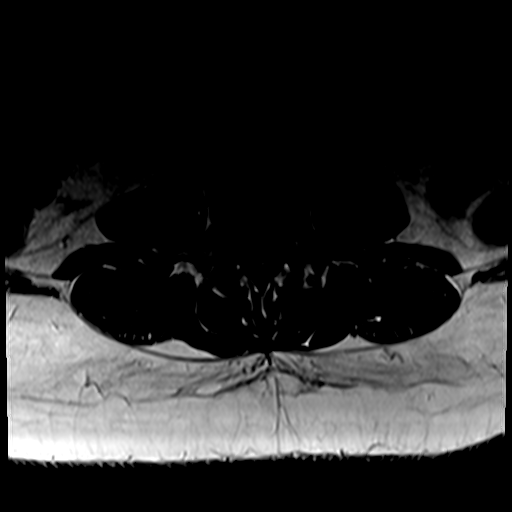
[im 17/32]
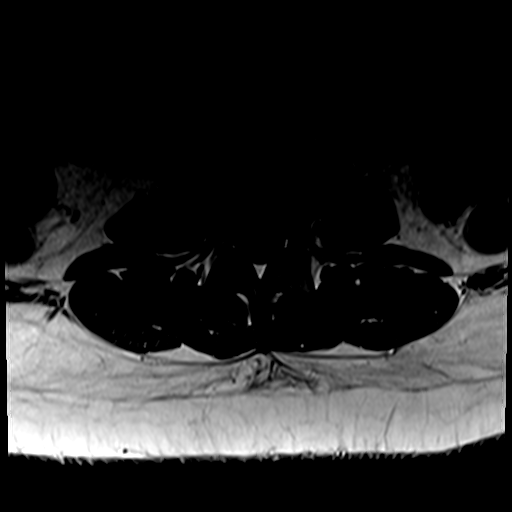
[im 22/32]
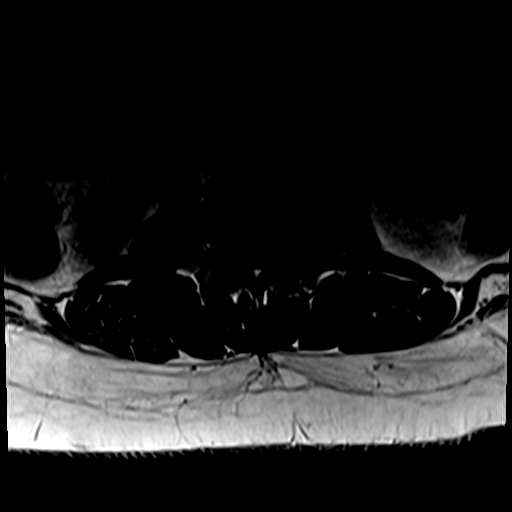
[im 27/32]
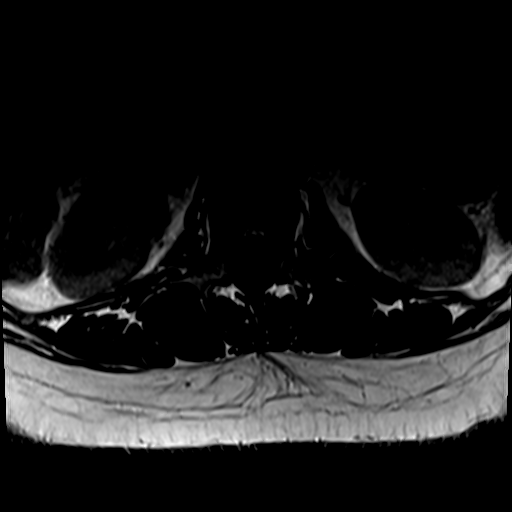
[im 32/32]
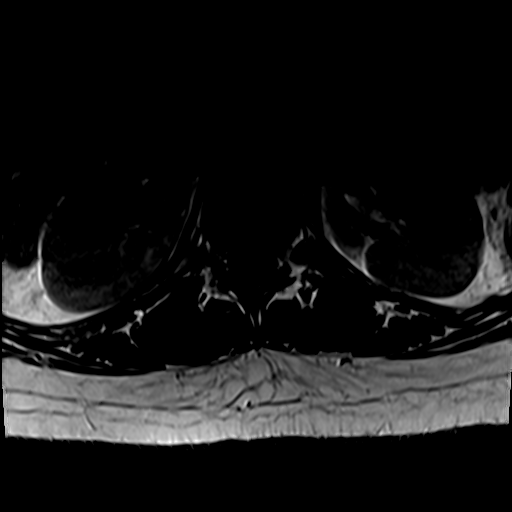

[31 of 48 positions shown; findings below may reference images not displayed]

FINDINGS: Segmentation: Standard. Lowest well-formed disc space labeled the
L5-S1 level.

Alignment: Physiologic with preservation of the normal lumbar
lordosis. No listhesis or subluxation.

Vertebrae: Vertebral body height maintained without evidence for
acute, subacute, or chronic fracture. Bone marrow signal intensity
within normal limits. No discrete or worrisome osseous lesions. No
abnormal marrow edema.

Conus medullaris and cauda equina: Conus extends to the L1 level.
Conus and cauda equina appear normal.

Paraspinal and other soft tissues: Paraspinous soft tissues within
normal limits. Visualized visceral structures are normal.

Disc levels:

No significant disc pathology seen within the lumbar spine.
Intervertebral discs are well hydrated with preserved disc height.
No disc bulge or disc protrusion. No significant facet disease. No
canal or neural foraminal stenosis. No impingement.
IMPRESSION: Normal MRI of the lumbar spine. No findings to explain patient's
symptoms identified.
